# Patient Record
Sex: Male | Born: 2000 | Hispanic: Yes | Marital: Single | Smoking: Never smoker
Health system: Southern US, Community
[De-identification: ages and names within clinical notes are randomized; demographics above are authoritative.]

---
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
## 2022-04-17 IMAGING — CT CT HEAD W/O CM
4 series · 16 of 47 positions shown, 18 images · non-contrast
Comparison: March 10, 2016

CLINICAL DATA: Headache.

EXAM:
CT HEAD WITHOUT CONTRAST
TECHNIQUE: Contiguous axial images were obtained from the base of the skull
through the vertex without intravenous contrast.

[Series 3: head wo · axial · 0.47mm/px · z∈[+1163,+1288]mm · 7 of 35 slices shown, 9 images]
[im 5/35  brain]
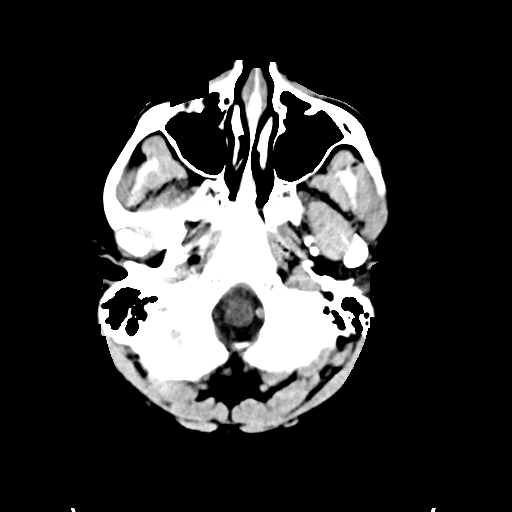
[im 5/35  bone]
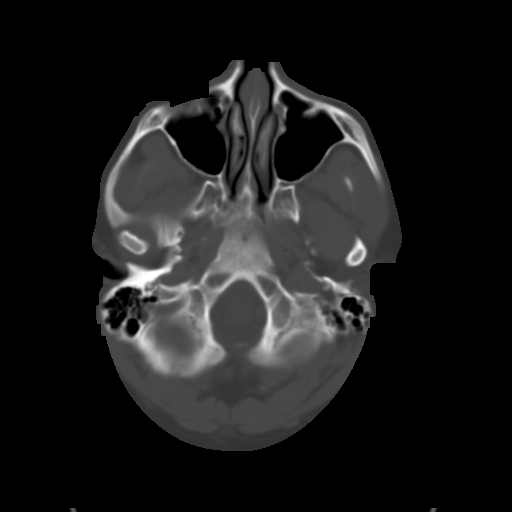
[im 9/35  brain]
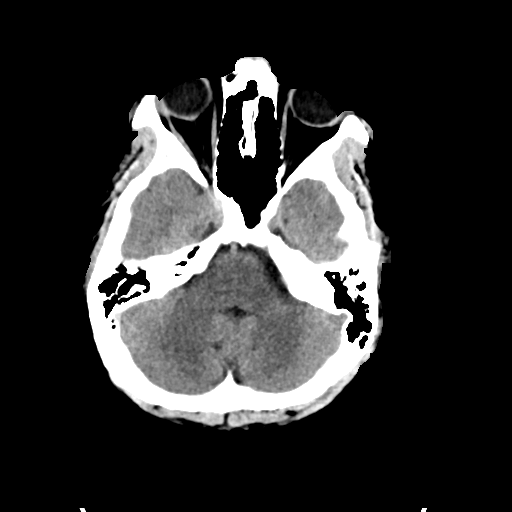
[im 13/35  brain]
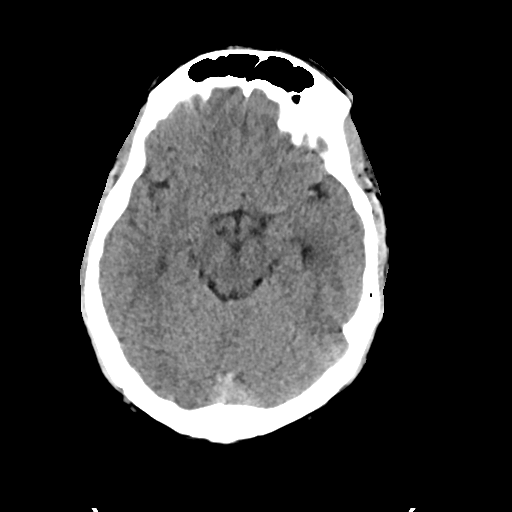
[im 18/35  brain]
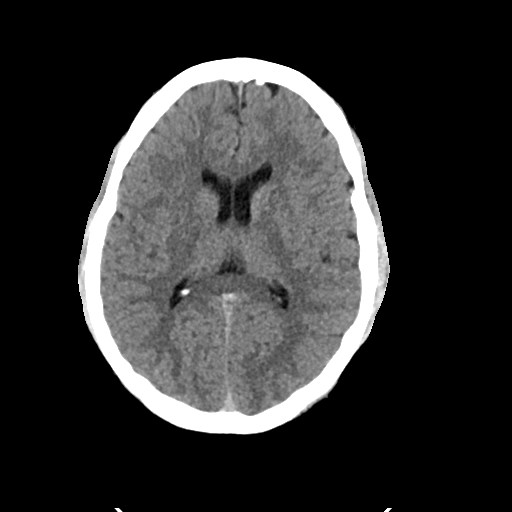
[im 22/35  brain]
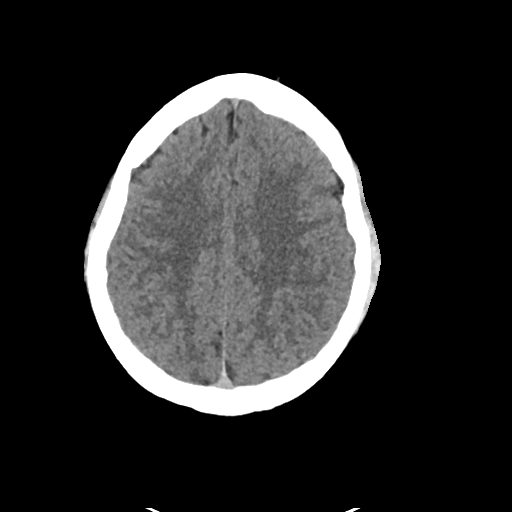
[im 22/35  bone]
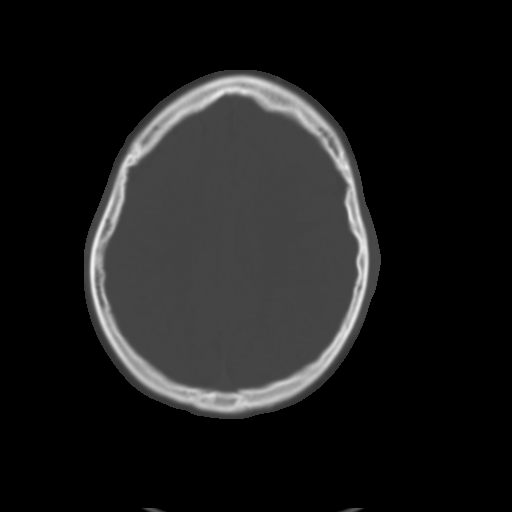
[im 26/35  brain]
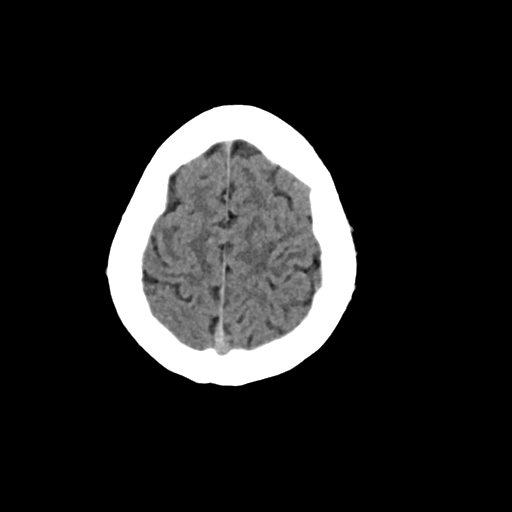
[im 30/35  brain]
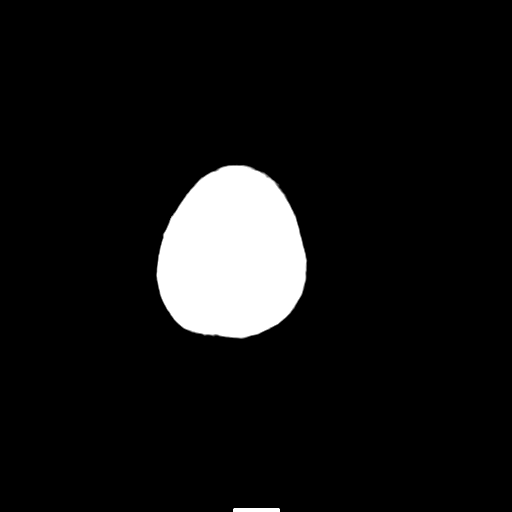

[Series 4: head bone · axial · 0.47mm/px · z∈[+1159,+1193]mm · 3 of 86 slices shown]
[im 9/86  bone]
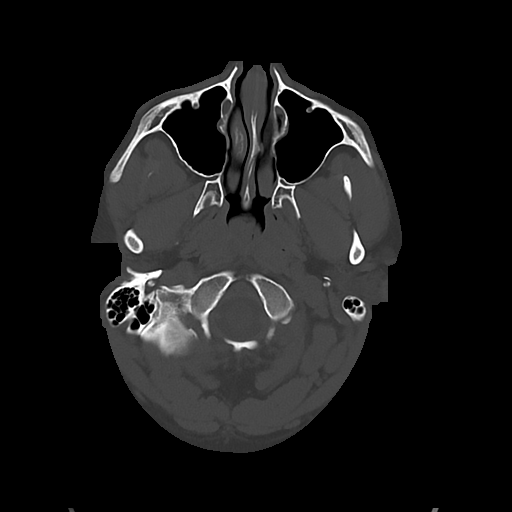
[im 18/86  bone]
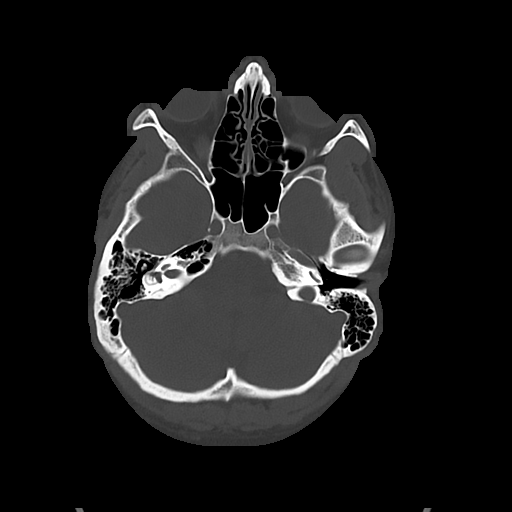
[im 26/86  bone]
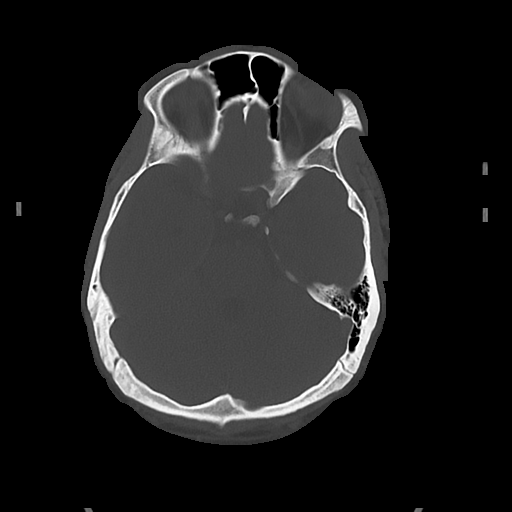

[Series 5: cor soft · coronal · 0.33mm/px · 3 of 76 slices shown]
[im 26/76  brain]
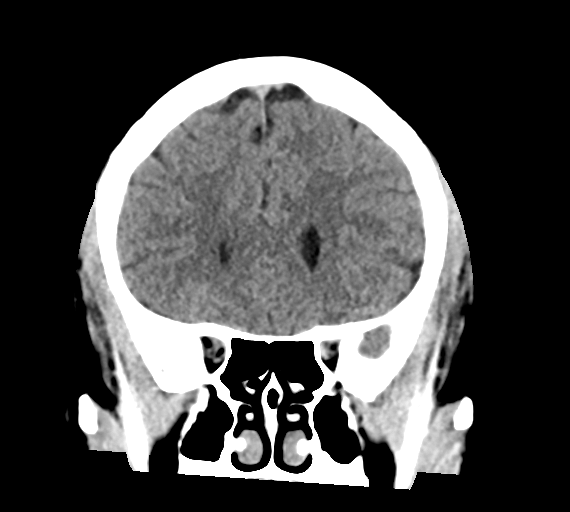
[im 34/76  brain]
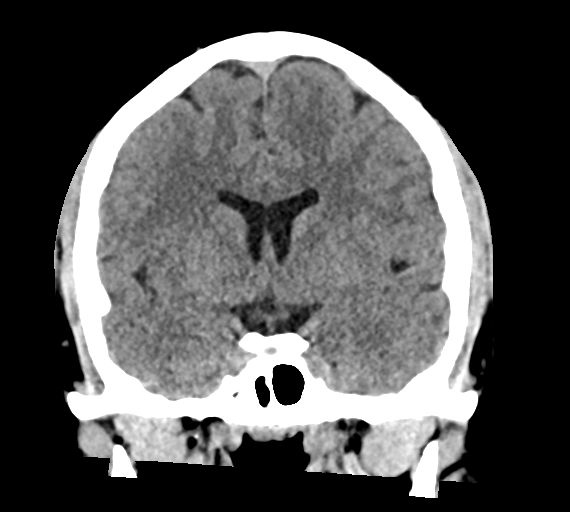
[im 42/76  brain]
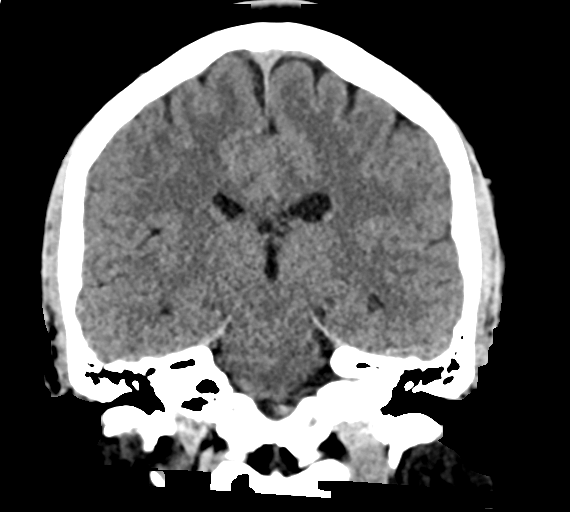

[Series 6: sag soft · sagittal · 0.33mm/px · 3 of 63 slices shown]
[im 21/63  brain]
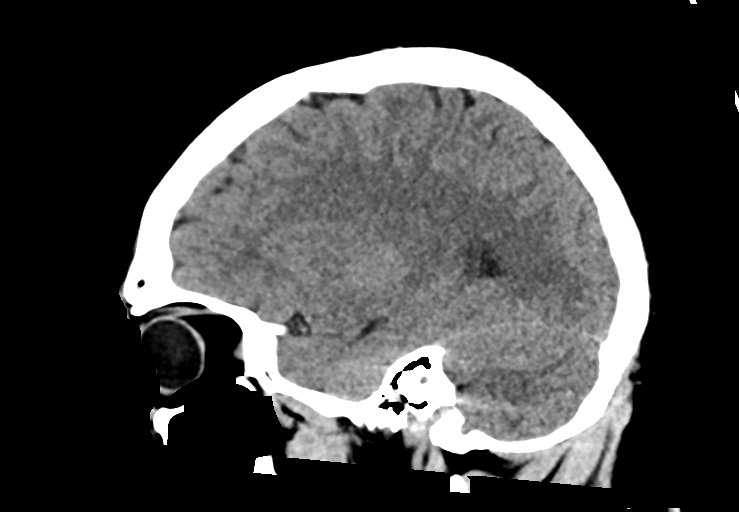
[im 32/63  brain]
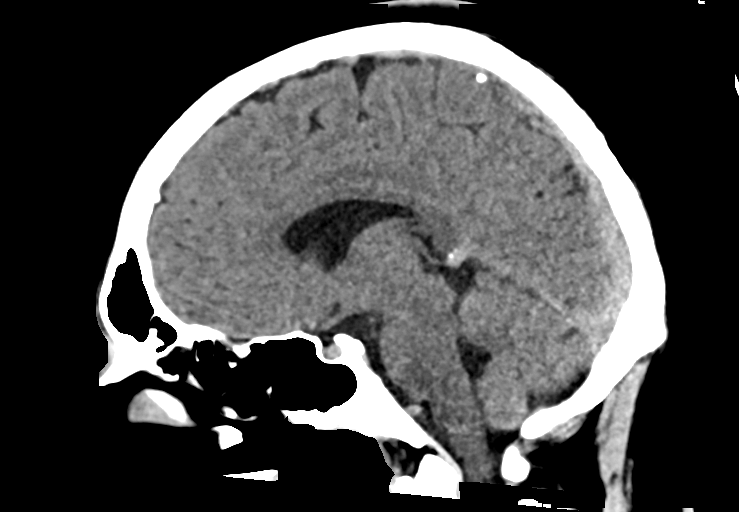
[im 42/63  brain]
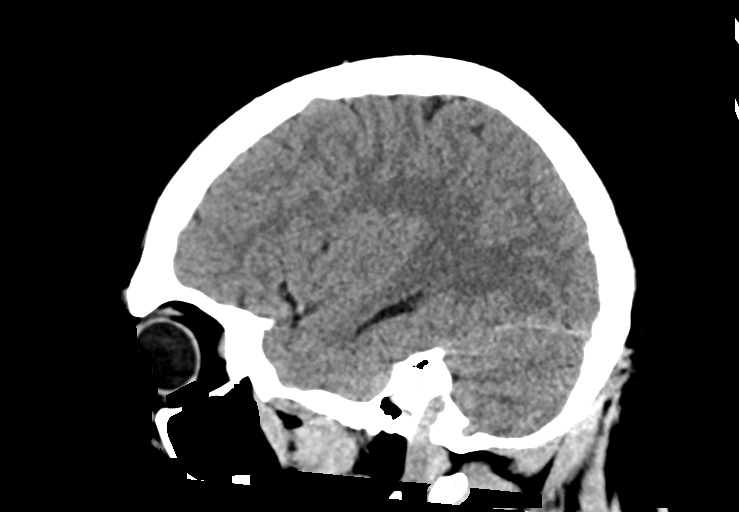

[16 of 47 positions shown; findings below may reference images not displayed]

FINDINGS: Brain: No evidence of acute infarction, hemorrhage, hydrocephalus,
extra-axial collection or mass lesion/mass effect.

Vascular: No hyperdense vessel or unexpected calcification.

Skull: Normal. Negative for fracture or focal lesion.

Sinuses/Orbits: No acute finding.

Other: None.
IMPRESSION: Normal brain.  No cause for headaches is identified.
